# Patient Record
Sex: Male | Born: 1960 | Race: Black or African American | Hispanic: No | Marital: Single
Health system: Southern US, Community
[De-identification: ages and names within clinical notes are randomized; demographics above are authoritative.]

---

## 2003-12-12 ENCOUNTER — Emergency Department (HOSPITAL_COMMUNITY): Admission: EM | Admit: 2003-12-12 | Discharge: 2003-12-12 | Payer: Self-pay | Admitting: Emergency Medicine

## 2020-07-18 ENCOUNTER — Emergency Department (HOSPITAL_COMMUNITY): Payer: Medicare Other

## 2020-07-18 ENCOUNTER — Other Ambulatory Visit: Payer: Self-pay

## 2020-07-18 ENCOUNTER — Emergency Department (HOSPITAL_COMMUNITY)
Admission: EM | Admit: 2020-07-18 | Discharge: 2020-07-18 | Disposition: A | Discharge status: Home / Self Care | Payer: Medicare Other | Attending: Emergency Medicine | Admitting: Emergency Medicine

## 2020-07-18 DIAGNOSIS — S301XXA Contusion of abdominal wall, initial encounter: Secondary | ICD-10-CM

## 2020-07-18 DIAGNOSIS — S0990XA Unspecified injury of head, initial encounter: Secondary | ICD-10-CM

## 2020-07-18 DIAGNOSIS — R079 Chest pain, unspecified: Secondary | ICD-10-CM | POA: Insufficient documentation

## 2020-07-18 DIAGNOSIS — S0081XA Abrasion of other part of head, initial encounter: Secondary | ICD-10-CM | POA: Diagnosis not present

## 2020-07-18 DIAGNOSIS — M545 Low back pain, unspecified: Secondary | ICD-10-CM | POA: Diagnosis not present

## 2020-07-18 DIAGNOSIS — M79602 Pain in left arm: Secondary | ICD-10-CM | POA: Insufficient documentation

## 2020-07-18 DIAGNOSIS — S298XXA Other specified injuries of thorax, initial encounter: Secondary | ICD-10-CM

## 2020-07-18 DIAGNOSIS — S40022A Contusion of left upper arm, initial encounter: Secondary | ICD-10-CM

## 2020-07-18 DIAGNOSIS — R109 Unspecified abdominal pain: Secondary | ICD-10-CM | POA: Insufficient documentation

## 2020-07-18 DIAGNOSIS — S161XXA Strain of muscle, fascia and tendon at neck level, initial encounter: Secondary | ICD-10-CM

## 2020-07-18 DIAGNOSIS — Y92410 Unspecified street and highway as the place of occurrence of the external cause: Secondary | ICD-10-CM | POA: Insufficient documentation

## 2020-07-18 LAB — CBC WITH DIFFERENTIAL/PLATELET
Abs Immature Granulocytes: 0.02 10*3/uL (ref 0.00–0.07)
Basophils Absolute: 0 10*3/uL (ref 0.0–0.1)
Basophils Relative: 0 %
Eosinophils Absolute: 0 10*3/uL (ref 0.0–0.5)
Eosinophils Relative: 0 %
HCT: 45.1 % (ref 39.0–52.0)
Hemoglobin: 14.5 g/dL (ref 13.0–17.0)
Immature Granulocytes: 0 %
Lymphocytes Relative: 28 %
Lymphs Abs: 1.3 10*3/uL (ref 0.7–4.0)
MCH: 30.7 pg (ref 26.0–34.0)
MCHC: 32.2 g/dL (ref 30.0–36.0)
MCV: 95.3 fL (ref 80.0–100.0)
Monocytes Absolute: 0.6 10*3/uL (ref 0.1–1.0)
Monocytes Relative: 12 %
Neutro Abs: 2.7 10*3/uL (ref 1.7–7.7)
Neutrophils Relative %: 60 %
Platelets: 241 10*3/uL (ref 150–400)
RBC: 4.73 MIL/uL (ref 4.22–5.81)
RDW: 13.5 % (ref 11.5–15.5)
WBC: 4.6 10*3/uL (ref 4.0–10.5)
nRBC: 0 % (ref 0.0–0.2)

## 2020-07-18 LAB — I-STAT CHEM 8, ED
BUN: 4 mg/dL — ABNORMAL LOW (ref 6–20)
Calcium, Ion: 1.06 mmol/L — ABNORMAL LOW (ref 1.15–1.40)
Chloride: 103 mmol/L (ref 98–111)
Creatinine, Ser: 0.9 mg/dL (ref 0.61–1.24)
Glucose, Bld: 87 mg/dL (ref 70–99)
HCT: 47 % (ref 39.0–52.0)
Hemoglobin: 16 g/dL (ref 13.0–17.0)
Potassium: 3.5 mmol/L (ref 3.5–5.1)
Sodium: 138 mmol/L (ref 135–145)
TCO2: 21 mmol/L — ABNORMAL LOW (ref 22–32)

## 2020-07-18 LAB — PROTIME-INR
INR: 1.1 (ref 0.8–1.2)
Prothrombin Time: 13.6 seconds (ref 11.4–15.2)

## 2020-07-18 LAB — COMPREHENSIVE METABOLIC PANEL
ALT: 25 U/L (ref 0–44)
AST: 49 U/L — ABNORMAL HIGH (ref 15–41)
Albumin: 3.6 g/dL (ref 3.5–5.0)
Alkaline Phosphatase: 84 U/L (ref 38–126)
Anion gap: 15 (ref 5–15)
BUN: <5 mg/dL — ABNORMAL LOW (ref 6–20)
CO2: 20 mmol/L — ABNORMAL LOW (ref 22–32)
Calcium: 9.4 mg/dL (ref 8.9–10.3)
Chloride: 101 mmol/L (ref 98–111)
Creatinine, Ser: 0.91 mg/dL (ref 0.61–1.24)
GFR, Estimated: >60 mL/min (ref >60)
Glucose, Bld: 83 mg/dL (ref 70–99)
Potassium: 4.2 mmol/L (ref 3.5–5.1)
Sodium: 136 mmol/L (ref 135–145)
Total Bilirubin: 0.9 mg/dL (ref 0.3–1.2)
Total Protein: 7.6 g/dL (ref 6.5–8.1)

## 2020-07-18 MED ORDER — IOHEXOL 300 MG/ML  SOLN
100.0000 mL | Freq: Once | INTRAMUSCULAR | Status: AC | PRN
  Administered 2020-07-18: 100 mL via INTRAVENOUS

## 2020-07-18 MED ORDER — SODIUM CHLORIDE 0.9 % IV BOLUS
1000.0000 mL | Freq: Once | INTRAVENOUS | Status: AC
  Administered 2020-07-18: 1000 mL via INTRAVENOUS
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
## 2020-07-18 NOTE — ED Notes (Signed)
Patient transported to CT 
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
## 2020-07-18 NOTE — Discharge Instructions (Addendum)
Continue your medications as previously prescribed.  Take ibuprofen 600 mg every 6 hours as needed for pain.  Apply bacitracin to your forehead abrasions twice daily for the next several days.  Follow-up with primary doctor if you are not feeling better in the next week, and return to the ER if symptoms significantly worsen or change.  You should also have your doctor recheck your blood pressure during this appointment.
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
## 2020-07-18 NOTE — ED Notes (Signed)
Pt discharge instructions reviewed with the patient. The patient verbalized understanding of instructions.
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
